# Patient Record
Sex: Female | Born: 1995 | Race: Black or African American | Hispanic: No | Marital: Single | Smoking: Never smoker
Health system: Southern US, Community
[De-identification: ages and names within clinical notes are randomized; demographics above are authoritative.]

## PROBLEM LIST (undated history)

## (undated) DIAGNOSIS — N200 Calculus of kidney: Secondary | ICD-10-CM

## (undated) DIAGNOSIS — N83209 Unspecified ovarian cyst, unspecified side: Secondary | ICD-10-CM

## (undated) DIAGNOSIS — N390 Urinary tract infection, site not specified: Secondary | ICD-10-CM

## (undated) HISTORY — PX: OVARIAN CYST SURGERY: SHX726

---
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
## 2016-01-12 ENCOUNTER — Emergency Department (HOSPITAL_COMMUNITY)
Admission: EM | Admit: 2016-01-12 | Discharge: 2016-01-12 | Disposition: A | Discharge status: Home / Self Care | Payer: No Typology Code available for payment source | Attending: Emergency Medicine | Admitting: Emergency Medicine

## 2016-01-12 ENCOUNTER — Emergency Department (HOSPITAL_COMMUNITY): Payer: No Typology Code available for payment source

## 2016-01-12 ENCOUNTER — Encounter (HOSPITAL_COMMUNITY): Payer: Self-pay | Admitting: Emergency Medicine

## 2016-01-12 DIAGNOSIS — S40212A Abrasion of left shoulder, initial encounter: Secondary | ICD-10-CM | POA: Insufficient documentation

## 2016-01-12 DIAGNOSIS — Z791 Long term (current) use of non-steroidal anti-inflammatories (NSAID): Secondary | ICD-10-CM | POA: Insufficient documentation

## 2016-01-12 DIAGNOSIS — T148XXA Other injury of unspecified body region, initial encounter: Secondary | ICD-10-CM

## 2016-01-12 DIAGNOSIS — Y9389 Activity, other specified: Secondary | ICD-10-CM | POA: Insufficient documentation

## 2016-01-12 DIAGNOSIS — R51 Headache: Secondary | ICD-10-CM | POA: Insufficient documentation

## 2016-01-12 DIAGNOSIS — Y929 Unspecified place or not applicable: Secondary | ICD-10-CM | POA: Insufficient documentation

## 2016-01-12 DIAGNOSIS — S80811A Abrasion, right lower leg, initial encounter: Secondary | ICD-10-CM | POA: Insufficient documentation

## 2016-01-12 DIAGNOSIS — S80812A Abrasion, left lower leg, initial encounter: Secondary | ICD-10-CM | POA: Insufficient documentation

## 2016-01-12 DIAGNOSIS — Y999 Unspecified external cause status: Secondary | ICD-10-CM | POA: Insufficient documentation

## 2016-01-12 DIAGNOSIS — M62838 Other muscle spasm: Secondary | ICD-10-CM

## 2016-01-12 LAB — CBC WITH DIFFERENTIAL/PLATELET
BASOS ABS: 0 10*3/uL (ref 0.0–0.1)
Basophils Relative: 0 %
Eosinophils Absolute: 0.1 10*3/uL (ref 0.0–0.7)
Eosinophils Relative: 1 %
HCT: 41.8 % (ref 36.0–46.0)
HEMOGLOBIN: 14.6 g/dL (ref 12.0–15.0)
LYMPHS ABS: 1.4 10*3/uL (ref 0.7–4.0)
Lymphocytes Relative: 16 %
MCH: 30.2 pg (ref 26.0–34.0)
MCHC: 34.9 g/dL (ref 30.0–36.0)
MCV: 86.4 fL (ref 78.0–100.0)
Monocytes Absolute: 1 10*3/uL (ref 0.1–1.0)
Monocytes Relative: 11 %
NEUTROS PCT: 72 %
Neutro Abs: 6.7 10*3/uL (ref 1.7–7.7)
Platelets: 293 10*3/uL (ref 150–400)
RBC: 4.84 MIL/uL (ref 3.87–5.11)
RDW: 12.4 % (ref 11.5–15.5)
WBC: 9.3 10*3/uL (ref 4.0–10.5)

## 2016-01-12 LAB — COMPREHENSIVE METABOLIC PANEL
ALBUMIN: 4.4 g/dL (ref 3.5–5.0)
ALK PHOS: 65 U/L (ref 38–126)
ALT: 27 U/L (ref 14–54)
AST: 24 U/L (ref 15–41)
Anion gap: 7 (ref 5–15)
BUN: 7 mg/dL (ref 6–20)
CALCIUM: 9.5 mg/dL (ref 8.9–10.3)
CO2: 27 mmol/L (ref 22–32)
Chloride: 104 mmol/L (ref 101–111)
Creatinine, Ser: 0.61 mg/dL (ref 0.44–1.00)
GFR calc Af Amer: >60 mL/min (ref >60)
GFR calc non Af Amer: >60 mL/min (ref >60)
GLUCOSE: 110 mg/dL — AB (ref 65–99)
Potassium: 3.5 mmol/L (ref 3.5–5.1)
Sodium: 138 mmol/L (ref 135–145)
Total Bilirubin: 0.7 mg/dL (ref 0.3–1.2)
Total Protein: 7.5 g/dL (ref 6.5–8.1)

## 2016-01-12 LAB — URINALYSIS, ROUTINE W REFLEX MICROSCOPIC
GLUCOSE, UA: NEGATIVE mg/dL
Hgb urine dipstick: NEGATIVE
Leukocytes, UA: NEGATIVE
Nitrite: NEGATIVE
Specific Gravity, Urine: 1.015 (ref 1.005–1.030)
pH: 8 (ref 5.0–8.0)

## 2016-01-12 LAB — URINE MICROSCOPIC-ADD ON: RBC / HPF: NONE SEEN RBC/hpf (ref 0–5)

## 2016-01-12 LAB — I-STAT BETA HCG BLOOD, ED (MC, WL, AP ONLY): I-stat hCG, quantitative: <5 m[IU]/mL (ref <5)

## 2016-01-12 MED ORDER — IBUPROFEN 400 MG PO TABS
600.0000 mg | ORAL_TABLET | Freq: Once | ORAL | Status: AC
  Administered 2016-01-12: 600 mg via ORAL
  Filled 2016-01-12: qty 2

## 2016-01-12 MED ORDER — IBUPROFEN 600 MG PO TABS: 600.0000 mg | ORAL_TABLET | Freq: Four times a day (QID) | ORAL | Status: AC | PRN

## 2016-01-12 MED ORDER — METHOCARBAMOL 500 MG PO TABS
500.0000 mg | ORAL_TABLET | Freq: Once | ORAL | Status: AC
  Administered 2016-01-12: 500 mg via ORAL
  Filled 2016-01-12: qty 1

## 2016-01-12 MED ORDER — METHOCARBAMOL 500 MG PO TABS: 1000.0000 mg | ORAL_TABLET | Freq: Three times a day (TID) | ORAL | Status: AC | PRN

## 2016-01-12 MED ORDER — HYDROCODONE-ACETAMINOPHEN 5-325 MG PO TABS: 1.0000 | ORAL_TABLET | ORAL | Status: AC | PRN

## 2016-01-12 MED ORDER — HYDROCODONE-ACETAMINOPHEN 5-325 MG PO TABS
1.0000 | ORAL_TABLET | Freq: Once | ORAL | Status: AC
  Administered 2016-01-12: 1 via ORAL
  Filled 2016-01-12: qty 1
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
## 2016-01-12 NOTE — Discharge Instructions (Signed)

## 2016-01-12 NOTE — ED Notes (Signed)
C-collar placed on pt.
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
